# Patient Record
Sex: Female | Born: 1990 | Race: White | Hispanic: No | Marital: Single | Smoking: Current every day smoker
Health system: Southern US, Community
[De-identification: ages and names within clinical notes are randomized; demographics above are authoritative.]

## PROBLEM LIST (undated history)

## (undated) HISTORY — PX: TONSILLECTOMY: SUR1361

---
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
## 2016-06-17 ENCOUNTER — Emergency Department (INDEPENDENT_AMBULATORY_CARE_PROVIDER_SITE_OTHER): Payer: BLUE CROSS/BLUE SHIELD

## 2016-06-17 ENCOUNTER — Encounter: Payer: Self-pay | Admitting: Emergency Medicine

## 2016-06-17 ENCOUNTER — Emergency Department (INDEPENDENT_AMBULATORY_CARE_PROVIDER_SITE_OTHER)
Admission: EM | Admit: 2016-06-17 | Discharge: 2016-06-17 | Disposition: A | Discharge status: Home / Self Care | Payer: BLUE CROSS/BLUE SHIELD | Source: Home / Self Care | Attending: Family Medicine | Admitting: Family Medicine

## 2016-06-17 DIAGNOSIS — M25532 Pain in left wrist: Secondary | ICD-10-CM

## 2016-06-17 DIAGNOSIS — M778 Other enthesopathies, not elsewhere classified: Secondary | ICD-10-CM | POA: Diagnosis not present

## 2016-06-17 MED ORDER — MELOXICAM 15 MG PO TABS: 15.0000 mg | ORAL_TABLET | Freq: Every day | ORAL | 0 refills | Status: AC

## 2016-06-17 MED ORDER — PREDNISONE 20 MG PO TABS: ORAL_TABLET | ORAL | 0 refills | Status: AC

## 2016-06-17 NOTE — ED Provider Notes (Signed)
[facility]    CSN: [id number] Arrival date & time: 06/17/16  1648     History   Chief Complaint Chief Complaint  Patient presents with  . Wrist Pain    HPI Amy Page is a 26 y.o. female.   Patient complains of pain and swelling in her dorsal left wrist for two days.  She has been taking Ibuprofen 800mg  every 6 hours during the past two days.  She works as a Producer, television/film/video, and recalls no injury but admits that she moved last weekend, lifting heavy boxes.  She is now having difficulty using her left hand/wrist to cut hair.   The history is provided by the patient.  Wrist Pain  This is a new problem. The current episode started 2 days ago. The problem occurs constantly. The problem has been gradually worsening. Associated symptoms comments: Decreased range of motion . Exacerbated by: movement of left wrist. Nothing relieves the symptoms. Treatments tried: ibuprofen. The treatment provided mild relief.    History reviewed. No pertinent past medical history.  There are no active problems to display for this patient.   Past Surgical History:  Procedure Laterality Date  . CESAREAN SECTION    . TONSILLECTOMY      OB History    No data available       Home Medications    Prior to Admission medications   Medication Sig Start Date End Date Taking? Authorizing Provider  ibuprofen (ADVIL,MOTRIN) 200 MG tablet Take 200 mg by mouth every 6 (six) hours as needed.   Yes [provider]  meloxicam (MOBIC) 15 MG tablet Take 1 tablet (15 mg total) by mouth daily. Take with food each morning 06/17/16   Lattie Haw, MD  predniSONE (DELTASONE) 20 MG tablet Take one tab by mouth twice daily for 5 days, then one daily. Take with food. 06/17/16   Lattie Haw, MD    Family History No family history on file.  Social History Social History  Substance Use Topics  . Smoking status: Current Every Day Smoker    Types: Cigarettes  . Smokeless tobacco: Never  Used  . Alcohol use No     Allergies   Amoxicillin   Review of Systems Review of Systems  All other systems reviewed and are negative.    Physical Exam Triage Vital Signs ED Triage Vitals [06/17/16 1707]  Enc Vitals Group     BP 99/65     Pulse Rate 71     Resp      Temp 98.6 F (37 C)     Temp Source Oral     SpO2 98 %     Weight 204 lb (92.5 kg)     Height 5\' 4"  (1.626 m)     Head Circumference      Peak Flow      Pain Score 7     Pain Loc      Pain Edu?      Excl. in GC?    No data found.   Updated Vital Signs BP 99/65 (BP Location: Left Arm)   Pulse 71   Temp 98.6 F (37 C) (Oral)   Ht 5\' 4"  (1.626 m)   Wt 204 lb (92.5 kg)   LMP 06/05/2016 (Exact Date)   SpO2 98%   BMI 35.02 kg/m   Visual Acuity Right Eye Distance:   Left Eye Distance:   Bilateral Distance:    Right Eye Near:   Left Eye Near:  Bilateral Near:     Physical Exam  Constitutional: She appears well-developed and well-nourished. No distress.  HENT:  Head: Normocephalic.  Eyes: Pupils are equal, round, and reactive to light.  Cardiovascular: Normal rate.   Pulmonary/Chest: Effort normal.  Musculoskeletal:       Left hand: She exhibits decreased range of motion, tenderness and swelling. She exhibits normal two-point discrimination, normal capillary refill, no deformity and no laceration.       Hands: Left wrist has distinct tenderness to palpation dorsally over extensor tendons.  No snuffbox tenderness.  Negative Tinel's and Phalen's tests.  Neurological: She is alert.  Skin: Skin is warm and dry.  Nursing note and vitals reviewed.    UC Treatments / Results  Labs (all labs ordered are listed, but only abnormal results are displayed) Labs Reviewed - No data to display  EKG  EKG Interpretation None       Radiology Dg Wrist Complete Left  Result Date: 06/17/2016 CLINICAL DATA:  Posterior left wrist pain and swelling for 3 days. No known injury. Initial encounter.  EXAM: LEFT WRIST - COMPLETE 3+ VIEW COMPARISON:  None. FINDINGS: There is no evidence of fracture or dislocation. There is no evidence of arthropathy or other focal bone abnormality. Soft tissues are unremarkable. IMPRESSION: Normal exam. Electronically Signed   By: Drusilla Kannerhomas  Dalessio M.D.   On: 06/17/2016 17:32    Procedures Procedures (including critical care time)  Medications Ordered in UC Medications - No data to display   Initial Impression / Assessment and Plan / UC Course  I have reviewed the triage vital signs and the nursing notes.  Pertinent labs & imaging results that were available during my care of the patient were reviewed by me and considered in my medical decision making (see chart for details).    Dispensed wrist splint. Begin prednisone burst/taper. Apply ice pack for 20 to 30 minutes, 3 to 4 times daily  Continue until pain and swelling decrease.  After finishing prednisone, may begin Mobic (meloxicam).  Begin range of motion and stretching exercises as tolerated. Followup with Dr. Rodney Langtonhomas Thekkekandam or Dr. Clementeen GrahamEvan Corey ([facility]) if not improving about two weeks.     Final Clinical Impressions(s) / UC Diagnoses   Final diagnoses:  Left wrist tendonitis    New Prescriptions New Prescriptions   MELOXICAM (MOBIC) 15 MG TABLET    Take 1 tablet (15 mg total) by mouth daily. Take with food each morning   PREDNISONE (DELTASONE) 20 MG TABLET    Take one tab by mouth twice daily for 5 days, then one daily. Take with food.     Lattie HawBeese, Ellyn Rubiano A, MD 06/20/16 1116

## 2016-06-17 NOTE — Discharge Instructions (Signed)
Apply ice pack for 20 to 30 minutes, 3 to 4 times daily  Continue until pain and swelling decrease.  After finishing prednisone, may begin Mobic (meloxicam).  Begin range of motion and stretching exercises as tolerated.

## 2016-06-17 NOTE — ED Triage Notes (Signed)
Left wrist pain x 2 days, swollen and painful. She moved last weekend, lifting boxes and she is a Interior and spatial designerhairdresser.

## 2018-05-12 IMAGING — DX DG WRIST COMPLETE 3+V*L*
4 series · 4 of 4 positions shown · non-contrast
Comparison: None.

CLINICAL DATA: Posterior left wrist pain and swelling for 3 days.
No known injury. Initial encounter.

EXAM:
LEFT WRIST - COMPLETE 3+ VIEW

[wrist pa]
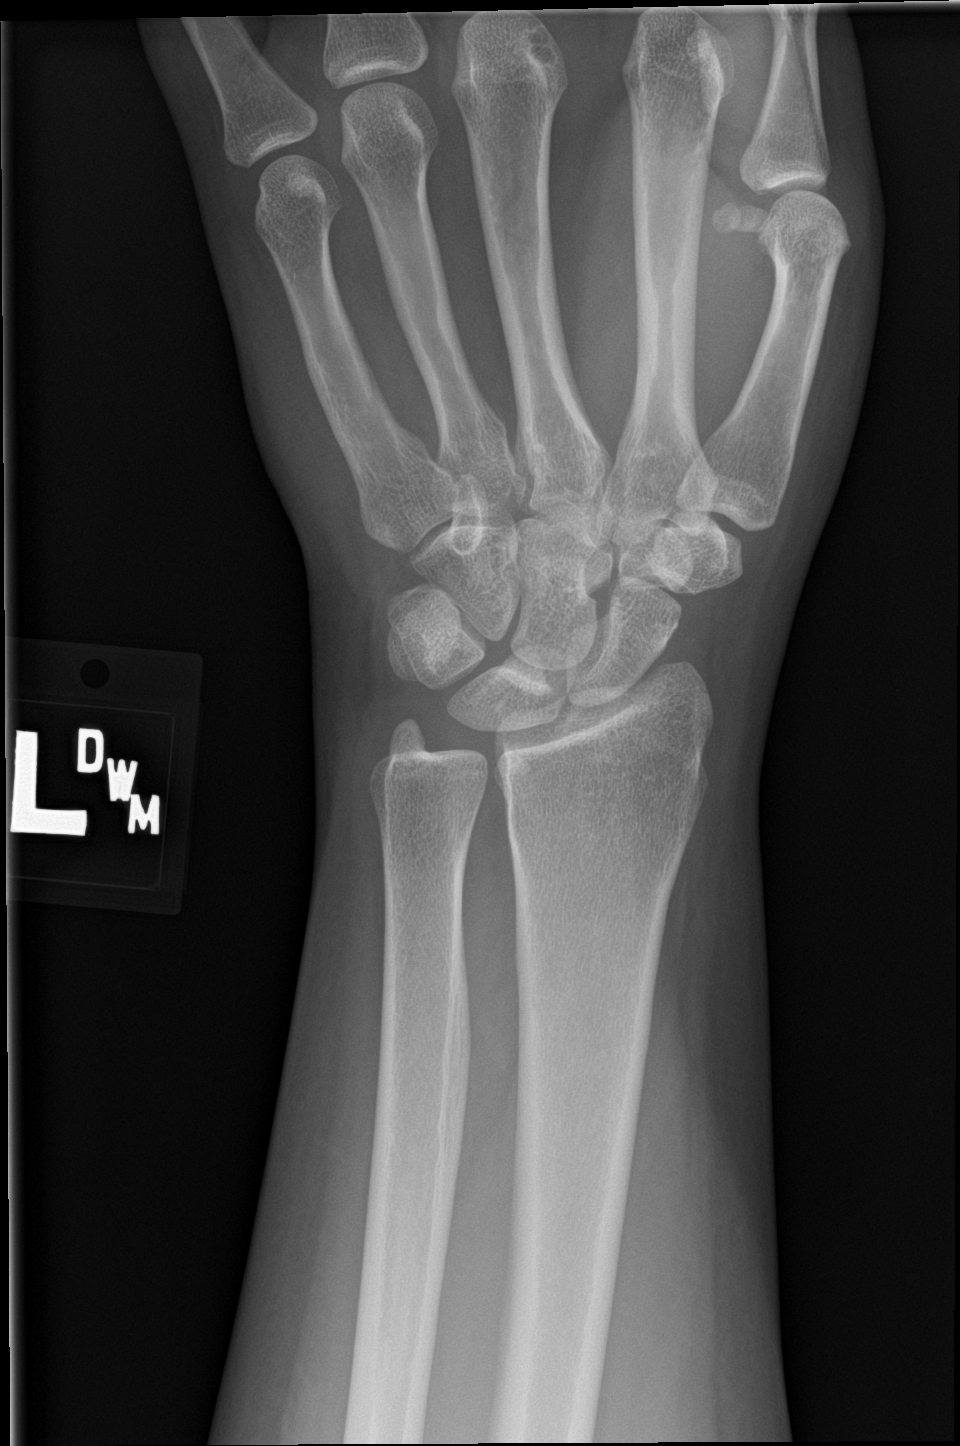

[wrist obl]
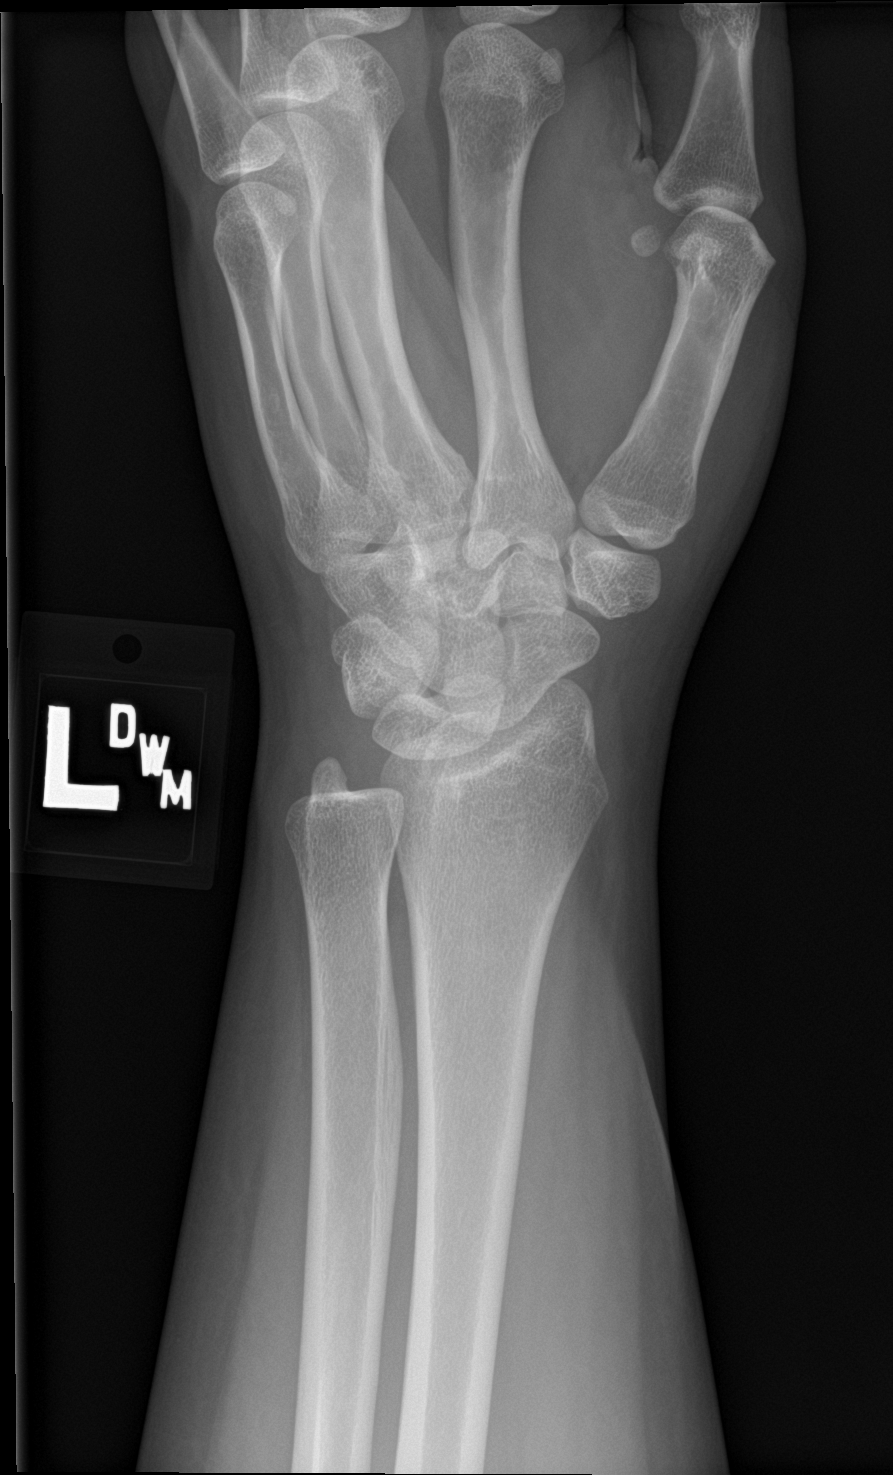

[wrist lat]
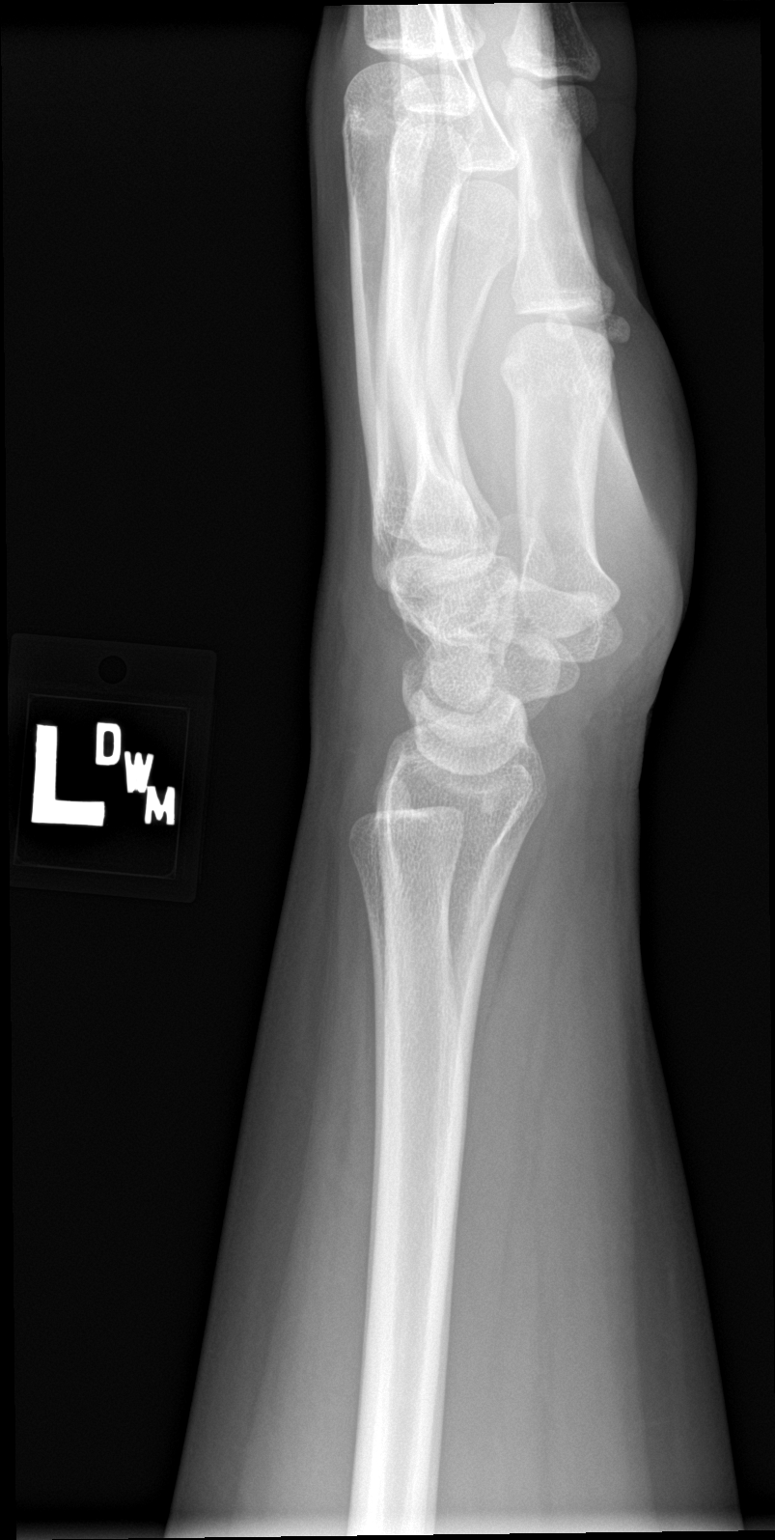

[wrist navicular]
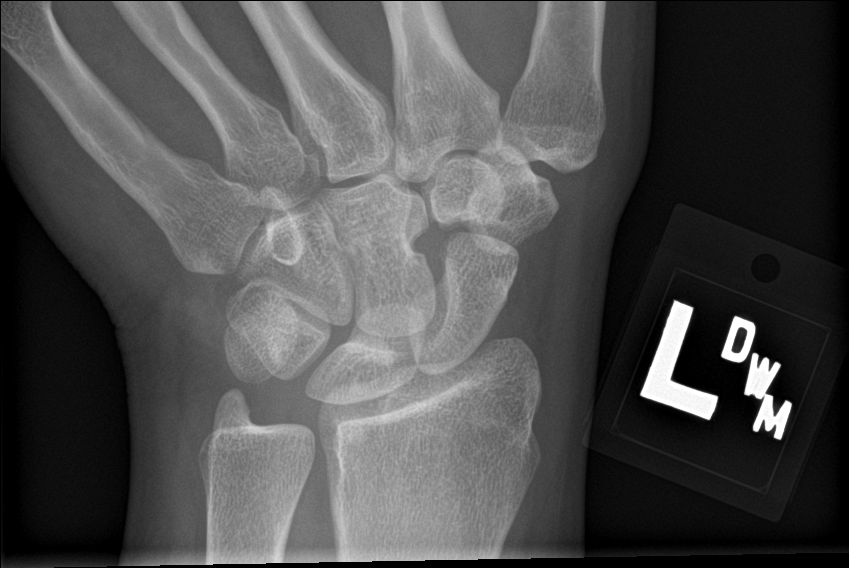

[4 of 4 positions shown; findings below may reference images not displayed]

FINDINGS: There is no evidence of fracture or dislocation. There is no
evidence of arthropathy or other focal bone abnormality. Soft
tissues are unremarkable.
IMPRESSION: Normal exam.
